# Patient Record
Sex: Male | Born: 1970 | Race: White | Hispanic: No | Marital: Single
Health system: Southern US, Community
[De-identification: ages and names within clinical notes are randomized; demographics above are authoritative.]

---

## 2019-01-10 ENCOUNTER — Emergency Department (HOSPITAL_COMMUNITY): Payer: Self-pay

## 2019-01-10 ENCOUNTER — Other Ambulatory Visit: Payer: Self-pay

## 2019-01-10 ENCOUNTER — Emergency Department (HOSPITAL_COMMUNITY)
Admission: EM | Admit: 2019-01-10 | Discharge: 2019-01-10 | Disposition: A | Discharge status: Home / Self Care | Payer: Self-pay | Attending: Emergency Medicine | Admitting: Emergency Medicine

## 2019-01-10 DIAGNOSIS — W010XXA Fall on same level from slipping, tripping and stumbling without subsequent striking against object, initial encounter: Secondary | ICD-10-CM | POA: Diagnosis not present

## 2019-01-10 DIAGNOSIS — M542 Cervicalgia: Secondary | ICD-10-CM | POA: Diagnosis not present

## 2019-01-10 DIAGNOSIS — Y9259 Other trade areas as the place of occurrence of the external cause: Secondary | ICD-10-CM | POA: Diagnosis not present

## 2019-01-10 DIAGNOSIS — Y990 Civilian activity done for income or pay: Secondary | ICD-10-CM | POA: Insufficient documentation

## 2019-01-10 DIAGNOSIS — Y9389 Activity, other specified: Secondary | ICD-10-CM | POA: Insufficient documentation

## 2019-01-10 DIAGNOSIS — M25551 Pain in right hip: Secondary | ICD-10-CM | POA: Diagnosis not present

## 2019-01-10 DIAGNOSIS — R202 Paresthesia of skin: Secondary | ICD-10-CM | POA: Insufficient documentation

## 2019-01-10 MED ORDER — METHOCARBAMOL 500 MG PO TABS: 500.0000 mg | ORAL_TABLET | Freq: Every evening | ORAL | 0 refills | Status: AC | PRN

## 2019-01-10 MED ORDER — METHOCARBAMOL 500 MG PO TABS
500.0000 mg | ORAL_TABLET | Freq: Once | ORAL | Status: AC
  Administered 2019-01-10: 500 mg via ORAL
  Filled 2019-01-10: qty 1

## 2019-01-10 MED ORDER — IBUPROFEN 400 MG PO TABS
600.0000 mg | ORAL_TABLET | Freq: Once | ORAL | Status: AC
  Administered 2019-01-10: 600 mg via ORAL
  Filled 2019-01-10: qty 1

## 2019-01-10 NOTE — ED Provider Notes (Signed)
[facility]PART[facility] Provider Note   CSN: [id number] Arrival date & time: 01/10/19  1556     History   Chief Complaint Chief Complaint  Patient presents with  . Fall  . Hip Pain    HPI Patrick Boyd is a 48 y.o. male who presents with a fall. No significant PMH. He states he was working on a job in [place] yesterday. He was on a dumpster, trying to unload pallets and he slipped in to a hole and fell on to his side. His coworker pushed him back up so he didn't fall on the ground. Initially he states he had no pain. He started driving back to [place] [facility] yesterday and at some point during the drive he started to experience posterior neck pain which radiates to the back of the head and shoulders bilaterally as well as a feeling of pins and needles in the right arm from the elbow down that would come and go. He states it feels like his arm is asleep. He also has this feeling in the bilateral lower extremities and some diffuse lower back pain. The symptoms improve when he extends his knees and elbows - it feels like the circulation is returning again. He denies pain radiating down the legs. He denies any weakness in the arms or legs. He denies head or neck injury or LOC. He is not on blood thinners.     HPI  No past medical history on file.  There are no active problems to display for this patient.    The histories are not reviewed yet. Please review them in the "History" navigator section and refresh this [place].      Home Medications    Prior to Admission medications   Not on File    Family History No family history on file.  Social History Social History   Tobacco Use  . Smoking status: Not on file  Substance Use Topics  . Alcohol use: Not on file  . Drug use: Not on file     Allergies   Patient has no known allergies.   Review of Systems Review of Systems  Respiratory: Negative for shortness of breath.    Cardiovascular: Negative for chest pain.  Gastrointestinal: Negative for abdominal pain.  Musculoskeletal: Positive for arthralgias, back pain, myalgias and neck pain. Negative for gait problem.  Skin: Negative for wound.  Neurological: Positive for numbness. Negative for syncope, weakness and headaches.     Physical Exam Updated Vital Signs BP (!) 132/96 (BP Location: Right Arm)   Pulse 78   Temp 98.4 F (36.9 C) (Oral)   Resp 16   SpO2 100%   Physical Exam Vitals signs and nursing note reviewed.  Constitutional:      General: He is not in acute distress.    Appearance: Normal appearance. He is well-developed. He is not ill-appearing.  HENT:     Head: Normocephalic and atraumatic.  Eyes:     General: No scleral icterus.       Right eye: No discharge.        Left eye: No discharge.     Conjunctiva/sclera: Conjunctivae normal.     Pupils: Pupils are equal, round, and reactive to light.  Neck:     Musculoskeletal: Normal range of motion.     Comments: Diffuse midline neck tenderness Cardiovascular:     Rate and Rhythm: Normal rate and regular rhythm.  Pulmonary:     Effort: Pulmonary effort is normal. No  respiratory distress.     Breath sounds: Normal breath sounds.  Abdominal:     General: There is no distension.     Palpations: Abdomen is soft.     Tenderness: There is no abdominal tenderness.  Musculoskeletal:     Comments: Mental Status:  Alert, oriented, thought content appropriate, able to give a coherent history. Speech fluent without evidence of aphasia. Able to follow 2 step commands without difficulty.  Cranial Nerves:  II:  Peripheral visual fields grossly normal, pupils equal, round, reactive to light III,IV, VI: ptosis not present, extra-ocular motions intact bilaterally  V,VII: smile symmetric, facial light touch sensation equal VIII: hearing grossly normal to voice  X: uvula elevates symmetrically  XI: bilateral shoulder shrug symmetric and strong  XII: midline tongue extension without fassiculations Motor:  Normal tone. 5/5 in upper and lower extremities bilaterally including strong and equal grip strength and dorsiflexion/plantar flexion Sensory: Pinprick and light touch normal in all extremities.  Deep Tendon Reflexes: 1+ and symmetric in the biceps and patella Cerebellar: normal finger-to-nose with bilateral upper extremities Gait: normal gait and balance CV: distal pulses palpable throughout    Skin:    General: Skin is warm and dry.  Neurological:     Mental Status: He is alert and oriented to person, place, and time.  Psychiatric:        Behavior: Behavior normal.      ED Treatments / Results  Labs (all labs ordered are listed, but only abnormal results are displayed) Labs Reviewed - No data to display  EKG None  Radiology Dg Hip Unilat W Or Wo Pelvis 2-3 Views Right  Result Date: 01/10/2019 CLINICAL DATA:  48 year old who fell into a dumpster at work yesterday while trying to place a Pallet on the dumpster, injuring the RIGHT hip. Diffuse RIGHT hip pain. Initial encounter. EXAM: DG HIP (WITH OR WITHOUT PELVIS) 2-3V RIGHT COMPARISON:  None. FINDINGS: No evidence of acute fracture or dislocation. Well preserved joint space. Well preserved bone mineral density. No intrinsic osseous abnormality. Included AP pelvis demonstrates a normal-appearing contralateral LEFT hip. Sacroiliac joints and symphysis pubis intact. Degenerative changes at the L3-4 level. IMPRESSION: Normal RIGHT hip. Electronically Signed   By: Hulan Saashomas  Lawrence M.D.   On: 01/10/2019 21:09    Procedures Procedures (including critical care time)  Medications Ordered in ED Medications - No data to display   Initial Impression / Assessment and Plan / ED Course  I have reviewed the triage vital signs and the nursing notes.  Pertinent labs & imaging results that were available during my care of the patient were reviewed by me and considered in my  medical decision making (see chart for details).  48 year old male presents with neck pain, back pain and paresthesias of the bilateral upper and lower extremities since an injury yesterday. Unclear etiology. He has tenderness of the neck and back without obvious abnormality. He does not have any motor weakness or bowel/bladder incontinence. He has intact pulses and normal cap refill so doubt any serious vascular injury. He did not have any chest or abdominal injury. Discussed with Dr. Judd Lienelo. Will obtain MRI of the C, T, L spine to r/o cord compression and obtain hip xray.  There was a significant delay in obtaining report for imaging due to a technical issue with the radiologists being able to generate a read. When the report was generated it was essentially negative. I discussed findings with the pt. He is agreeable to NSAIDs and muscle  relaxers. He lives in [place] and was advised to f/u with his doctor if he continues to be symptomatic.  Final Clinical Impressions(s) / ED Diagnoses   Final diagnoses:  None    ED Discharge Orders    None       Bethel BornGekas, Belynda Pagaduan Marie, PA-C 01/11/19 1249    Geoffery Lyonselo, Douglas, MD 01/13/19 [phone]

## 2019-01-10 NOTE — ED Notes (Signed)
Pt to MRI with tech

## 2019-01-10 NOTE — ED Triage Notes (Signed)
Pt fell in a dumpster at work yesterday and is c/o right hip pain and arm pain , no loc did not hit head

## 2019-01-10 NOTE — Discharge Instructions (Signed)
Take Ibuprofen 400-600mg  as needed Take Robaxin (muscle relaxer) as needed for muscle pain and spasms  Use a heating pad for stiff/sore muscles Please follow up with your doctor

## 2020-03-23 IMAGING — MR MRI CERVICAL SPINE WITHOUT CONTRAST
10 of 16 series · 28 of 48 positions shown · non-contrast
Comparison: None available.

CLINICAL DATA: Initial evaluation for acute trauma, fall. Now with
acute pain to posterior and anterior right hip, right lower
extremity.

EXAM:
MRI CERVICAL, THORACIC AND LUMBAR SPINE WITHOUT CONTRAST
TECHNIQUE: Multiplanar and multiecho pulse sequences of the cervical spine, to
include the craniocervical junction and cervicothoracic junction,
and thoracic and lumbar spine, were obtained without intravenous
contrast.

[Series 5: T2 · sagittal · 3.0mm · 0.69mm/px · 3 of 15 slices shown (1 of 6)]
[im 1/15]
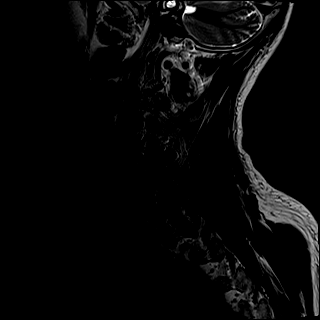
[im 8/15]
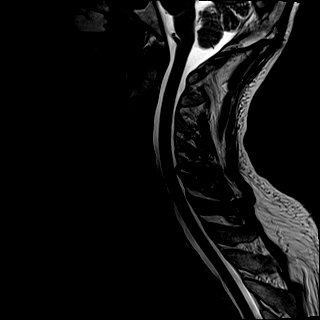
[im 15/15]
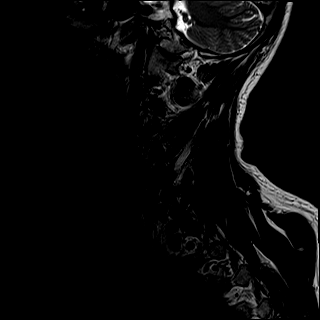

[Series 6: T1 · sagittal · 3.0mm · 0.69mm/px · 2 of 15 slices shown (1 of 4)]
[im 1/15]
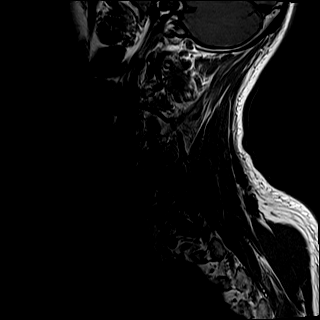
[im 15/15]
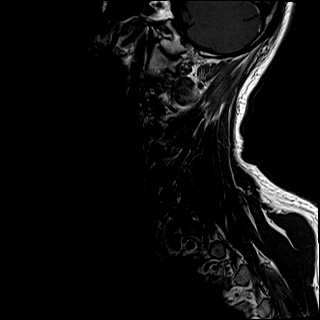

[Series 8: T2 · axial · 3.0mm · 0.66mm/px · z∈[-112,+15]mm · 5 of 40 slices shown (2 of 6)]
[im 1/40]
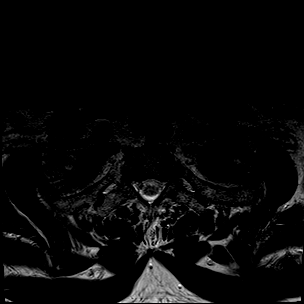
[im 10/40]
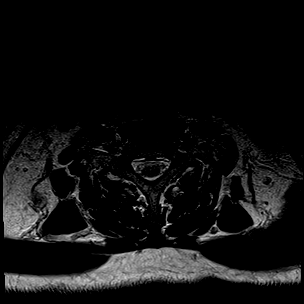
[im 20/40]
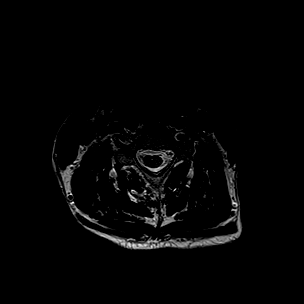
[im 30/40]
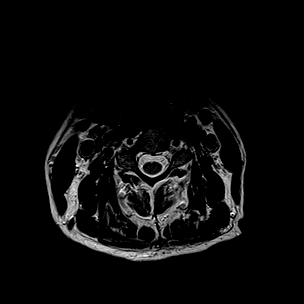
[im 40/40]
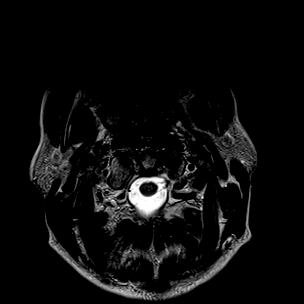

[Series 27: T1 · sagittal · 6.0mm · 1.23mm/px · 1 of 9 slices shown (2 of 4)]
[im 1/9]
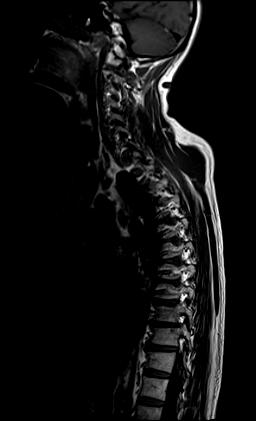

[Series 28: T2 · sagittal · 3.0mm · 0.76mm/px · 2 of 19 slices shown (3 of 6)]
[im 1/19]
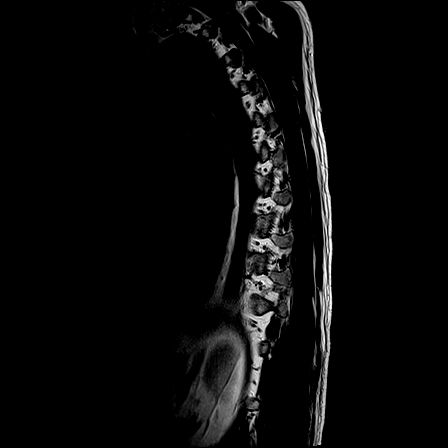
[im 19/19]
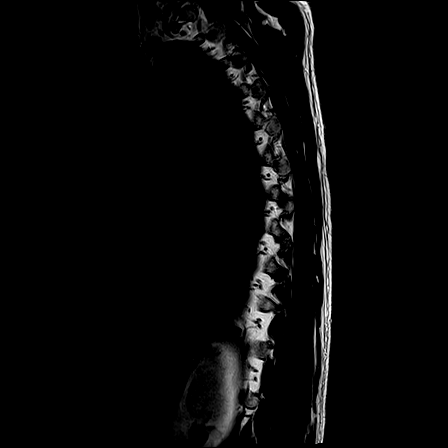

[Series 29: T1 · sagittal · 3.0mm · 0.76mm/px · 2 of 19 slices shown (3 of 4)]
[im 1/19]
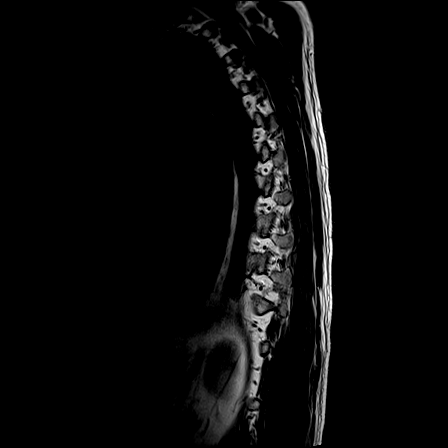
[im 19/19]
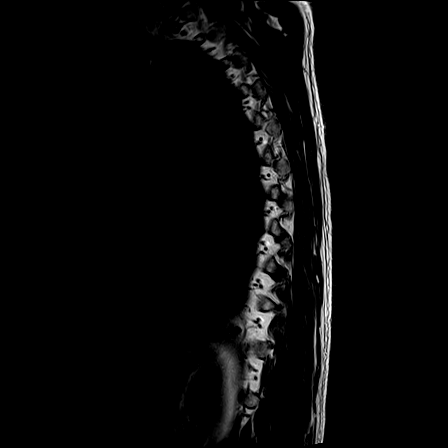

[Series 31: T2 · axial · 4.0mm · 0.59mm/px · z∈[-372,-101]mm · 5 of 39 slices shown (4 of 6)]
[im 1/39]
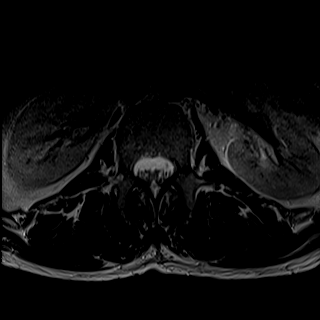
[im 10/39]
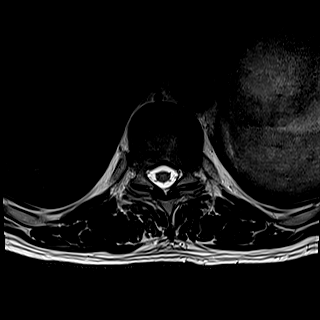
[im 20/39]
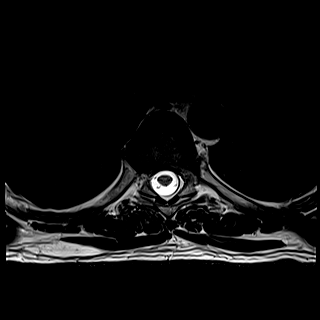
[im 29/39]
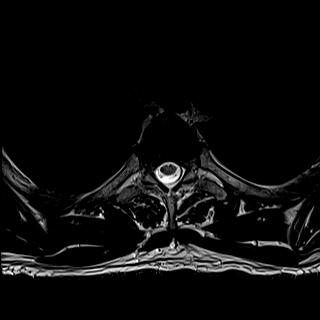
[im 39/39]
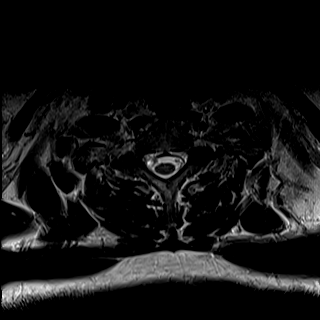

[Series 33: T2 · sagittal · 4.0mm · 0.73mm/px · 2 of 16 slices shown (5 of 6)]
[im 1/16]
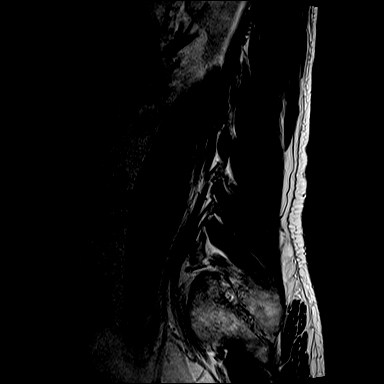
[im 16/16]
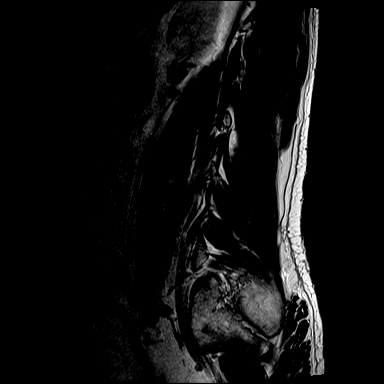

[Series 36: T1 · sagittal · 4.0mm · 0.88mm/px · 2 of 16 slices shown (4 of 4)]
[im 1/16]
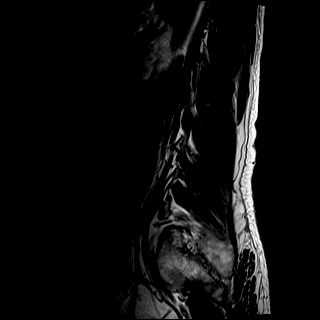
[im 16/16]
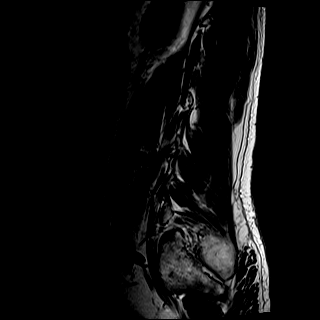

[Series 37: T2 · axial · 4.0mm · 0.57mm/px · z∈[-584,-395]mm · 4 of 33 slices shown (6 of 6)]
[im 1/33]
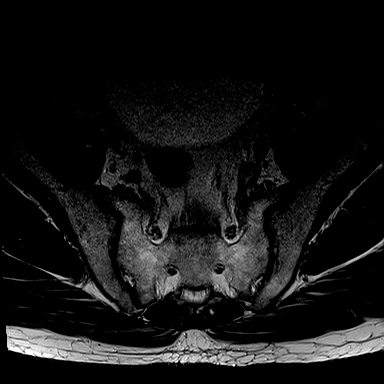
[im 11/33]
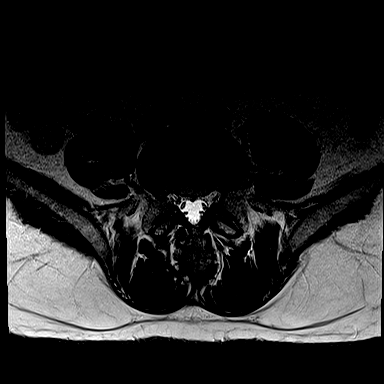
[im 22/33]
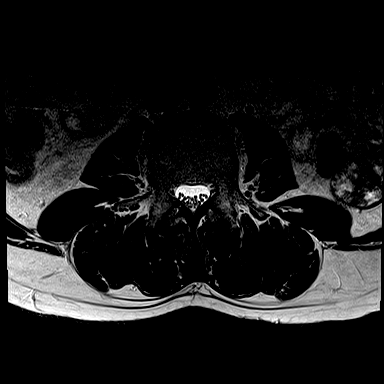
[im 33/33]
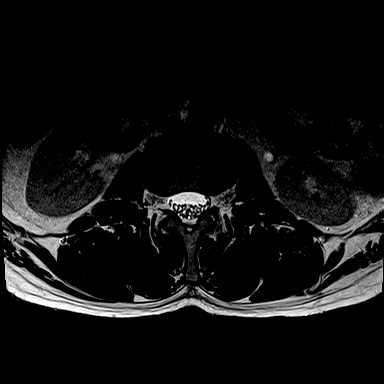

[28 of 48 positions shown; findings below may reference images not displayed]

FINDINGS: MRI CERVICAL SPINE FINDINGS

Alignment: Vertebral bodies normally aligned with preservation of
the normal cervical lordosis. No listhesis.

Vertebrae: Vertebral body height maintained without evidence for
acute or chronic fracture. Bone marrow signal intensity within
normal limits. No discrete or worrisome osseous lesions. No abnormal
marrow edema.

Cord: Signal intensity within the cervical spinal cord is within
normal limits. Normal cord caliber and morphology.

Posterior Fossa, vertebral arteries, paraspinal tissues: Visualized
brain and posterior fossa within normal limits. Craniocervical
junction normal. Paraspinous and prevertebral soft tissues within
normal limits. Normal intravascular flow voids seen within the
vertebral arteries bilaterally.

Disc levels:

C2-C3: Mild bilateral uncovertebral hypertrophy without significant
disc bulge. No significant spinal stenosis. Mild right C3 foraminal
narrowing.

C3-C4: Minimal uncovertebral hypertrophy. No significant canal or
foraminal stenosis.

C4-C5: Mild disc bulge with bilateral uncovertebral hypertrophy.
Right greater than left facet degeneration. No significant spinal
stenosis. Severe right with mild left C5 foraminal narrowing.

C5-C6: Chronic diffuse degenerative disc osteophyte with left
greater than right uncovertebral hypertrophy. Broad posterior
component flattens and partially faces the ventral thecal sac,
greater on the left. Mild spinal stenosis without cord deformity.
Severe left with moderate right C6 foraminal narrowing.

C6-C7: Chronic intervertebral disc space narrowing with diffuse
degenerative disc osteophyte. Broad posterior component flattens and
mildly effaces the ventral thecal sac with resultant mild spinal
stenosis. No cord deformity. Severe bilateral C7 foraminal
narrowing.

C7-T1: Chronic intervertebral disc space narrowing with mild diffuse
degenerative disc osteophyte. Mild facet hypertrophy. No spinal
stenosis. Moderate left with severe right C8 foraminal stenosis.

MRI THORACIC SPINE FINDINGS

Alignment: Vertebral bodies normally aligned with preservation of
the normal thoracic kyphosis. No listhesis or subluxation.

Vertebrae: Vertebral body height maintained without evidence for
acute or chronic fracture. Scattered chronic endplate Schmorl's node
noted throughout the mid and lower thoracic spine. Bone marrow
signal intensity within normal limits. No discrete or worrisome
osseous lesions. No abnormal marrow edema.

Cord: Signal intensity within the thoracic spinal cord is within
normal limits.

Paraspinal and other soft tissues: Paraspinous soft tissues within
normal limits. Partially visualized lungs are grossly clear.
Approximate 1.5 cm cyst noted within the interpolar left kidney.
Visualized visceral structures otherwise unremarkable.

Disc levels:

T1-2:  Minimal disc bulge.  No stenosis.

T2-3: Mild disc bulge with facet hypertrophy. No significant
stenosis.

T3-4:  Unremarkable.

T4-5:  Unremarkable.

T5-6: Mild disc bulge with chronic endplate Schmorl's node
deformity. No significant stenosis.

T6-7: Disc desiccation with mild Schmorl's node deformity. No
stenosis.

T7-8: Shallow left paracentral disc protrusion minimally flattens
the left ventral thecal sac. No significant stenosis or cord
deformity.

T8-9:  Mild facet hypertrophy.  No significant stenosis.

T9-10: Mild disc bulge with bilateral facet hypertrophy. No
significant stenosis.

T10-11: Mild diffuse disc bulge, slightly asymmetric to the left.
Mild facet hypertrophy. No significant spinal stenosis. Mild left
neural foraminal narrowing.

T11-12:  Unremarkable.

T12-L1:  Unremarkable.

MRI LUMBAR SPINE FINDINGS

Segmentation: Standard. Lowest well-formed disc labeled the L5-S1
level.

Alignment: Physiologic with preservation of the normal lumbar
lordosis.

Vertebrae: Vertebral body height maintained without evidence for
acute or chronic fracture. Bone marrow signal intensity within
normal limits. No discrete or worrisome osseous lesions. No abnormal
marrow edema.

Conus medullaris and cauda equina: Conus extends to the L1 level.
Conus and cauda equina appear normal.

Paraspinal and other soft tissues: Paraspinous soft tissues
demonstrate no acute abnormality. Moderate distension of the
partially visualized urinary bladder noted.

Disc levels:

L1-2:  Negative interspace.  Mild facet hypertrophy.  No stenosis.

L2-3: Mild diffuse disc bulge with disc desiccation. Mild facet
hypertrophy. No significant canal or foraminal stenosis.

L3-4: Chronic intervertebral disc space narrowing with diffuse disc
bulge and disc desiccation. Mild reactive endplate changes.
Superimposed small right subarticular disc extrusion with slight
inferior migration (series 37, image 18). Extruded disc material
contacts the descending right L4 nerve root in the right lateral
recess. Mild to moderate right greater than left lateral recess
stenosis. Central canal remains relatively patent. Mild bilateral L3
foraminal narrowing.

L4-5: Mild disc bulge with disc desiccation. Mild to moderate facet
hypertrophy. Resultant mild canal with bilateral lateral recess
stenosis. Mild bilateral L4 foraminal narrowing.

L5-S1: Mild disc bulge with disc desiccation. Moderate left with
mild right facet hypertrophy. No significant stenosis.
IMPRESSION: MRI CERVICAL SPINE IMPRESSION:

1. No acute abnormality within the cervical spine. No cord
compression or cord signal changes to suggest myelopathy.
2. Moderate multilevel cervical spondylolysis with resultant mild
spinal stenosis at C5-6 and C6-7.
3. Multifactorial degenerative changes with resultant moderate to
severe bilateral C5, C6, C7, and C8 foraminal stenosis as above.

MRI THORACIC SPINE IMPRESSION:

1. No acute abnormality within the thoracic spine. No cord
compression or cord signal changes to suggest myelopathy.
2. Mild multilevel degenerative spondylolysis without significant
spinal stenosis as detailed above.

MRI LUMBAR SPINE IMPRESSION:

1. No acute abnormality within the lumbar spine. No evidence for
cord compression.
2. Small right subarticular disc extrusion at L3-4, contacting and
potentially affecting the descending right L4 nerve root.
3. Additional mild degenerative changes elsewhere within the lumbar
spine as detailed above without significant stenosis or neural
impingement.
